# Patient Record
Sex: Female | Born: 1965 | Race: White | Hispanic: No | Marital: Married | Smoking: Never smoker
Health system: Southern US, Community
[De-identification: ages and names within clinical notes are randomized; demographics above are authoritative.]

## PROBLEM LIST (undated history)

## (undated) DIAGNOSIS — H6690 Otitis media, unspecified, unspecified ear: Secondary | ICD-10-CM

## (undated) DIAGNOSIS — J45909 Unspecified asthma, uncomplicated: Secondary | ICD-10-CM

## (undated) DIAGNOSIS — I10 Essential (primary) hypertension: Secondary | ICD-10-CM

## (undated) DIAGNOSIS — E785 Hyperlipidemia, unspecified: Secondary | ICD-10-CM

## (undated) DIAGNOSIS — M109 Gout, unspecified: Secondary | ICD-10-CM

## (undated) HISTORY — PX: OTHER SURGICAL HISTORY: SHX169

## (undated) HISTORY — DX: Gout, unspecified: M10.9

## (undated) HISTORY — DX: Essential (primary) hypertension: I10

## (undated) HISTORY — DX: Otitis media, unspecified, unspecified ear: H66.90

## (undated) HISTORY — DX: Hyperlipidemia, unspecified: E78.5

## (undated) HISTORY — DX: Unspecified asthma, uncomplicated: J45.909

---

## 2011-01-13 LAB — HM MAMMOGRAPHY: HM MAMMO: NEGATIVE

## 2012-02-20 LAB — HM PAP SMEAR: HM PAP: NORMAL

## 2012-10-06 ENCOUNTER — Other Ambulatory Visit: Payer: Self-pay

## 2013-07-02 ENCOUNTER — Other Ambulatory Visit: Payer: Self-pay | Admitting: *Deleted

## 2013-07-02 DIAGNOSIS — I10 Essential (primary) hypertension: Secondary | ICD-10-CM

## 2013-07-02 DIAGNOSIS — E785 Hyperlipidemia, unspecified: Secondary | ICD-10-CM

## 2013-07-02 DIAGNOSIS — M79609 Pain in unspecified limb: Secondary | ICD-10-CM

## 2013-07-02 DIAGNOSIS — R5381 Other malaise: Secondary | ICD-10-CM

## 2013-07-05 ENCOUNTER — Other Ambulatory Visit: Payer: Self-pay

## 2013-07-05 LAB — CBC WITH DIFFERENTIAL/PLATELET
Basophils Absolute: 0 10*3/uL (ref 0.0–0.1)
Basophils Relative: 0 % (ref 0–1)
Eosinophils Absolute: 0.2 10*3/uL (ref 0.0–0.7)
Eosinophils Relative: 2 % (ref 0–5)
HCT: 41.4 % (ref 36.0–46.0)
Hemoglobin: 14.1 g/dL (ref 12.0–15.0)
Lymphocytes Relative: 25 % (ref 12–46)
Lymphs Abs: 2.4 10*3/uL (ref 0.7–4.0)
MCH: 30.3 pg (ref 26.0–34.0)
MCHC: 34.1 g/dL (ref 30.0–36.0)
MCV: 89 fL (ref 78.0–100.0)
Monocytes Absolute: 0.4 10*3/uL (ref 0.1–1.0)
Monocytes Relative: 4 % (ref 3–12)
Neutro Abs: 6.7 10*3/uL (ref 1.7–7.7)
Neutrophils Relative %: 69 % (ref 43–77)
Platelets: 354 10*3/uL (ref 150–400)
RBC: 4.65 MIL/uL (ref 3.87–5.11)
RDW: 13.7 % (ref 11.5–15.5)
WBC: 9.7 10*3/uL (ref 4.0–10.5)

## 2013-07-05 LAB — URIC ACID: Uric Acid, Serum: 6.6 mg/dL (ref 2.4–7.0)

## 2013-07-05 LAB — LIPID PANEL
Cholesterol: 215 mg/dL — ABNORMAL HIGH (ref 0–200)
HDL: 53 mg/dL (ref >39)
LDL Cholesterol: 106 mg/dL — ABNORMAL HIGH (ref 0–99)
Total CHOL/HDL Ratio: 4.1 Ratio
Triglycerides: 280 mg/dL — ABNORMAL HIGH (ref <150)
VLDL: 56 mg/dL — ABNORMAL HIGH (ref 0–40)

## 2013-07-05 LAB — TSH: TSH: 1.423 u[IU]/mL (ref 0.350–4.500)

## 2013-07-07 ENCOUNTER — Other Ambulatory Visit: Payer: Self-pay | Admitting: Family Medicine

## 2013-07-16 ENCOUNTER — Encounter: Payer: Self-pay | Admitting: *Deleted

## 2013-07-16 DIAGNOSIS — I10 Essential (primary) hypertension: Secondary | ICD-10-CM

## 2013-07-16 DIAGNOSIS — E785 Hyperlipidemia, unspecified: Secondary | ICD-10-CM

## 2013-07-29 ENCOUNTER — Other Ambulatory Visit: Payer: Self-pay | Admitting: Family Medicine

## 2013-07-29 DIAGNOSIS — Z1231 Encounter for screening mammogram for malignant neoplasm of breast: Secondary | ICD-10-CM

## 2013-08-02 ENCOUNTER — Ambulatory Visit (HOSPITAL_BASED_OUTPATIENT_CLINIC_OR_DEPARTMENT_OTHER)
Admission: RE | Admit: 2013-08-02 | Discharge: 2013-08-02 | Disposition: A | Discharge status: Home / Self Care | Payer: BC Managed Care – PPO | Source: Ambulatory Visit | Attending: Family Medicine | Admitting: Family Medicine

## 2013-08-02 ENCOUNTER — Ambulatory Visit (INDEPENDENT_AMBULATORY_CARE_PROVIDER_SITE_OTHER): Payer: BC Managed Care – PPO | Admitting: Family Medicine

## 2013-08-02 ENCOUNTER — Encounter: Payer: Self-pay | Admitting: Family Medicine

## 2013-08-02 VITALS — BP 132/93 | HR 92 | Resp 16 | Ht 64.0 in | Wt 202.0 lb

## 2013-08-02 DIAGNOSIS — I10 Essential (primary) hypertension: Secondary | ICD-10-CM

## 2013-08-02 DIAGNOSIS — Z1231 Encounter for screening mammogram for malignant neoplasm of breast: Secondary | ICD-10-CM

## 2013-08-02 DIAGNOSIS — Z3049 Encounter for surveillance of other contraceptives: Secondary | ICD-10-CM

## 2013-08-02 MED ORDER — IRBESARTAN 300 MG PO TABS: 300.0000 mg | ORAL_TABLET | Freq: Every day | ORAL | Status: AC

## 2013-08-02 MED ORDER — NORETHIN-ETH ESTRADIOL-FE 0.8-25 MG-MCG PO CHEW: 1.0000 | CHEWABLE_TABLET | Freq: Every day | ORAL | Status: AC

## 2013-08-02 NOTE — Patient Instructions (Signed)
Hypertriglyceridemia  Diet for High blood levels of Triglycerides Most fats in food are triglycerides. Triglycerides in your blood are stored as fat in your body. High levels of triglycerides in your blood may put you at a greater risk for heart disease and stroke.  Normal triglyceride levels are less than 150 mg/dL. Borderline high levels are 150-199 mg/dl. High levels are 200 - 499 mg/dL, and very high triglyceride levels are greater than 500 mg/dL. The decision to treat high triglycerides is generally based on the level. For people with borderline or high triglyceride levels, treatment includes weight loss and exercise. Drugs are recommended for people with very high triglyceride levels. Many people who need treatment for high triglyceride levels have metabolic syndrome. This syndrome is a collection of disorders that often include: insulin resistance, high blood pressure, blood clotting problems, high cholesterol and triglycerides. TESTING PROCEDURE FOR TRIGLYCERIDES  You should not eat 4 hours before getting your triglycerides measured. The normal range of triglycerides is between 10 and 250 milligrams per deciliter (mg/dl). Some people may have extreme levels (1000 or above), but your triglyceride level may be too high if it is above 150 mg/dl, depending on what other risk factors you have for heart disease.  People with high blood triglycerides may also have high blood cholesterol levels. If you have high blood cholesterol as well as high blood triglycerides, your risk for heart disease is probably greater than if you only had high triglycerides. High blood cholesterol is one of the main risk factors for heart disease. CHANGING YOUR DIET  Your weight can affect your blood triglyceride level. If you are more than 20% above your ideal body weight, you may be able to lower your blood triglycerides by losing weight. Eating less and exercising regularly is the best way to combat this. Fat provides more  calories than any other food. The best way to lose weight is to eat less fat. Only 30% of your total calories should come from fat. Less than 7% of your diet should come from saturated fat. A diet low in fat and saturated fat is the same as a diet to decrease blood cholesterol. By eating a diet lower in fat, you may lose weight, lower your blood cholesterol, and lower your blood triglyceride level.  Eating a diet low in fat, especially saturated fat, may also help you lower your blood triglyceride level. Ask your dietitian to help you figure how much fat you can eat based on the number of calories your caregiver has prescribed for you.  Exercise, in addition to helping with weight loss may also help lower triglyceride levels.   Alcohol can increase blood triglycerides. You may need to stop drinking alcoholic beverages.  Too much carbohydrate in your diet may also increase your blood triglycerides. Some complex carbohydrates are necessary in your diet. These may include bread, rice, potatoes, other starchy vegetables and cereals.  Reduce "simple" carbohydrates. These may include pure sugars, candy, honey, and jelly without losing other nutrients. If you have the kind of high blood triglycerides that is affected by the amount of carbohydrates in your diet, you will need to eat less sugar and less high-sugar foods. Your caregiver can help you with this.  Adding 2-4 grams of fish oil (EPA+ DHA) may also help lower triglycerides. Speak with your caregiver before adding any supplements to your regimen. Following the Diet  Maintain your ideal weight. Your caregivers can help you with a diet. Generally, eating less food and getting more   exercise will help you lose weight. Joining a weight control group may also help. Ask your caregivers for a good weight control group in your area.  Eat low-fat foods instead of high-fat foods. This can help you lose weight too.  These foods are lower in fat. Eat MORE of these:    Dried beans, peas, and lentils.  Egg whites.  Low-fat cottage cheese.  Fish.  Lean cuts of meat, such as round, sirloin, rump, and flank (cut extra fat off meat you fix).  Whole grain breads, cereals and pasta.  Skim and nonfat dry milk.  Low-fat yogurt.  Poultry without the skin.  Cheese made with skim or part-skim milk, such as mozzarella, parmesan, farmers', ricotta, or pot cheese. These are higher fat foods. Eat LESS of these:   Whole milk and foods made from whole milk, such as American, blue, cheddar, monterey jack, and swiss cheese  High-fat meats, such as luncheon meats, sausages, knockwurst, bratwurst, hot dogs, ribs, corned beef, ground pork, and regular ground beef.  Fried foods. Limit saturated fats in your diet. Substituting unsaturated fat for saturated fat may decrease your blood triglyceride level. You will need to read package labels to know which products contain saturated fats.  These foods are high in saturated fat. Eat LESS of these:   Fried pork skins.  Whole milk.  Skin and fat from poultry.  Palm oil.  Butter.  Shortening.  Cream cheese.  Bacon.  Margarines and baked goods made from listed oils.  Vegetable shortenings.  Chitterlings.  Fat from meats.  Coconut oil.  Palm kernel oil.  Lard.  Cream.  Sour cream.  Fatback.  Coffee whiteners and non-dairy creamers made with these oils.  Cheese made from whole milk. Use unsaturated fats (both polyunsaturated and monounsaturated) moderately. Remember, even though unsaturated fats are better than saturated fats; you still want a diet low in total fat.  These foods are high in unsaturated fat:   Canola oil.  Sunflower oil.  Mayonnaise.  Almonds.  Peanuts.  Pine nuts.  Margarines made with these oils.  Safflower oil.  Olive oil.  Avocados.  Cashews.  Peanut butter.  Sunflower seeds.  Soybean oil.  Peanut  oil.  Olives.  Pecans.  Walnuts.  Pumpkin seeds. Avoid sugar and other high-sugar foods. This will decrease carbohydrates without decreasing other nutrients. Sugar in your food goes rapidly to your blood. When there is excess sugar in your blood, your liver may use it to make more triglycerides. Sugar also contains calories without other important nutrients.  Eat LESS of these:   Sugar, brown sugar, powdered sugar, jam, jelly, preserves, honey, syrup, molasses, pies, candy, cakes, cookies, frosting, pastries, colas, soft drinks, punches, fruit drinks, and regular gelatin.  Avoid alcohol. Alcohol, even more than sugar, may increase blood triglycerides. In addition, alcohol is high in calories and low in nutrients. Ask for sparkling water, or a diet soft drink instead of an alcoholic beverage. Suggestions for planning and preparing meals   Bake, broil, grill or roast meats instead of frying.  Remove fat from meats and skin from poultry before cooking.  Add spices, herbs, lemon juice or vinegar to vegetables instead of salt, rich sauces or gravies.  Use a non-stick skillet without fat or use no-stick sprays.  Cool and refrigerate stews and broth. Then remove the hardened fat floating on the surface before serving.  Refrigerate meat drippings and skim off fat to make low-fat gravies.  Serve more fish.  Use less butter,   margarine and other high-fat spreads on bread or vegetables.  Use skim or reconstituted non-fat dry milk for cooking.  Cook with low-fat cheeses.  Substitute low-fat yogurt or cottage cheese for all or part of the sour cream in recipes for sauces, dips or congealed salads.  Use half yogurt/half mayonnaise in salad recipes.  Substitute evaporated skim milk for cream. Evaporated skim milk or reconstituted non-fat dry milk can be whipped and substituted for whipped cream in certain recipes.  Choose fresh fruits for dessert instead of high-fat foods such as pies or  cakes. Fruits are naturally low in fat. When Dining Out   Order low-fat appetizers such as fruit or vegetable juice, pasta with vegetables or tomato sauce.  Select clear, rather than cream soups.  Ask that dressings and gravies be served on the side. Then use less of them.  Order foods that are baked, broiled, poached, steamed, stir-fried, or roasted.  Ask for margarine instead of butter, and use only a small amount.  Drink sparkling water, unsweetened tea or coffee, or diet soft drinks instead of alcohol or other sweet beverages. QUESTIONS AND ANSWERS ABOUT OTHER FATS IN THE BLOOD: SATURATED FAT, TRANS FAT, AND CHOLESTEROL What is trans fat? Trans fat is a type of fat that is formed when vegetable oil is hardened through a process called hydrogenation. This process helps makes foods more solid, gives them shape, and prolongs their shelf life. Trans fats are also called hydrogenated or partially hydrogenated oils.  What do saturated fat, trans fat, and cholesterol in foods have to do with heart disease? Saturated fat, trans fat, and cholesterol in the diet all raise the level of LDL "bad" cholesterol in the blood. The higher the LDL cholesterol, the greater the risk for coronary heart disease (CHD). Saturated fat and trans fat raise LDL similarly.  What foods contain saturated fat, trans fat, and cholesterol? High amounts of saturated fat are found in animal products, such as fatty cuts of meat, chicken skin, and full-fat dairy products like butter, whole milk, cream, and cheese, and in tropical vegetable oils such as palm, palm kernel, and coconut oil. Trans fat is found in some of the same foods as saturated fat, such as vegetable shortening, some margarines (especially hard or stick margarine), crackers, cookies, baked goods, fried foods, salad dressings, and other processed foods made with partially hydrogenated vegetable oils. Small amounts of trans fat also occur naturally in some animal  products, such as milk products, beef, and lamb. Foods high in cholesterol include liver, other organ meats, egg yolks, shrimp, and full-fat dairy products. How can I use the new food label to make heart-healthy food choices? Check the Nutrition Facts panel of the food label. Choose foods lower in saturated fat, trans fat, and cholesterol. For saturated fat and cholesterol, you can also use the Percent Daily Value (%DV): 5% DV or less is low, and 20% DV or more is high. (There is no %DV for trans fat.) Use the Nutrition Facts panel to choose foods low in saturated fat and cholesterol, and if the trans fat is not listed, read the ingredients and limit products that list shortening or hydrogenated or partially hydrogenated vegetable oil, which tend to be high in trans fat. POINTS TO REMEMBER:   Discuss your risk for heart disease with your caregivers, and take steps to reduce risk factors.  Change your diet. Choose foods that are low in saturated fat, trans fat, and cholesterol.  Add exercise to your daily routine if   it is not already being done. Participate in physical activity of moderate intensity, like brisk walking, for at least 30 minutes on most, and preferably all days of the week. No time? Break the 30 minutes into three, 10-minute segments during the day.  Stop smoking. If you do smoke, contact your caregiver to discuss ways in which they can help you quit.  Do not use street drugs.  Maintain a normal weight.  Maintain a healthy blood pressure.  Keep up with your blood work for checking the fats in your blood as directed by your caregiver. Document Released: 04/18/2004 Document Revised: 12/31/2011 Document Reviewed: 11/14/2008 [employer] Patient Information 2014 [employer].  

## 2013-08-02 NOTE — Progress Notes (Signed)
   Subjective:    Patient ID: Cecelia ByarsMelanie Odem, female    DOB: 22-Jun-1966, 48 y.o.   MRN: [id number]  HPI  Shawna OrleansMelanie is here today to go over her most recent lab results and to discuss the conditions listed below:   1)  Hypertension:  She takes Ibesartan 150 mg (1/2 of 300 mg) and needs a refill on it.    2)  Oral Contraceptive:  She needs a refill on her birth control.    Review of Systems  Genitourinary: Negative for menstrual problem.  Neurological: Negative for light-headedness.  All other systems reviewed and are negative.    Past Medical History  Diagnosis Date  . Otitis media   . Asthma   . Hypertension   . Gout   . Hyperlipidemia      Past Surgical History  Procedure Laterality Date  . 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 Stroke Father   . Hypertension Father   . Hyperlipidemia Father   . Thyroid disease Paternal Grandmother   . Heart attack Paternal Grandfather   . Stroke Paternal Grandfather   . Depression Paternal Grandfather   . Hypertension Paternal Grandfather   . Hyperlipidemia Paternal Grandfather   . Alzheimer's disease Maternal Grandmother     Allergies  Allergen Reactions  . Benzoyl Peroxide Cleanser [Benzoyl Peroxide] Hives     Immunization History  Administered Date(s) Administered  . Tdap 01/06/2009      Objective:   Physical Exam  Vitals reviewed. Constitutional: She is oriented to person, place, and time.  Eyes:  Conjunctivae are normal. No scleral icterus.  Neck: Neck supple. No thyromegaly present.  Cardiovascular: Normal rate, regular rhythm and normal heart sounds.   Pulmonary/Chest: Effort normal and breath sounds normal.  Musculoskeletal: She exhibits no edema and no tenderness.  Lymphadenopathy:    She has no cervical adenopathy.  Neurological: She is alert and oriented to person, place, and time.  Skin: Skin is warm and dry.  Psychiatric: She has a normal mood and affect. Her behavior is normal. Judgment and thought content normal.     Assessment & Plan:    Shawna OrleansMelanie was seen today for medication management.  Diagnoses and associated orders for this visit:  Essential hypertension, benign - irbesartan (AVAPRO) 300 MG tablet; Take 1 tablet (300 mg total) by mouth daily.  Surveillance of other previously prescribed contraceptive method - Norethindrone-Ethinyl Estradiol-Fe (GENERESS FE) 0.8-25 MG-MCG tablet; Chew 1 tablet by mouth daily.

## 2013-08-19 ENCOUNTER — Encounter: Payer: Self-pay | Admitting: Family Medicine

## 2013-08-19 ENCOUNTER — Ambulatory Visit (INDEPENDENT_AMBULATORY_CARE_PROVIDER_SITE_OTHER): Payer: BC Managed Care – PPO | Admitting: Family Medicine

## 2013-08-19 ENCOUNTER — Other Ambulatory Visit (HOSPITAL_COMMUNITY)
Admission: RE | Admit: 2013-08-19 | Discharge: 2013-08-19 | Disposition: A | Discharge status: Home / Self Care | Payer: BC Managed Care – PPO | Source: Ambulatory Visit | Attending: Family Medicine | Admitting: Family Medicine

## 2013-08-19 VITALS — BP 131/74 | HR 87 | Resp 16 | Ht 64.0 in | Wt 204.0 lb

## 2013-08-19 DIAGNOSIS — Z124 Encounter for screening for malignant neoplasm of cervix: Secondary | ICD-10-CM

## 2013-08-19 DIAGNOSIS — Z1151 Encounter for screening for human papillomavirus (HPV): Secondary | ICD-10-CM | POA: Insufficient documentation

## 2013-08-19 DIAGNOSIS — Z01419 Encounter for gynecological examination (general) (routine) without abnormal findings: Secondary | ICD-10-CM | POA: Insufficient documentation

## 2013-08-19 DIAGNOSIS — Z0000 Encounter for general adult medical examination without abnormal findings: Secondary | ICD-10-CM

## 2013-08-19 NOTE — Patient Instructions (Signed)

## 2013-08-19 NOTE — Progress Notes (Addendum)
Subjective:    Patient ID: Brianna Ramos, female    DOB: 1966/01/20, 48 y.o.   MRN: [id number]  HPI  Brianna Ramos is here today for her annual CPE with pap smear.  She has done well since her last office visit.  She does not have any medical concerns today.     Review of Systems  Constitutional: Negative for activity change, appetite change, fatigue and unexpected weight change.  HENT: Negative for congestion, dental problem, ear pain, hearing loss, trouble swallowing and voice change.   Eyes: Negative for pain, redness and visual disturbance.  Respiratory: Negative for cough and shortness of breath.   Cardiovascular: Negative for chest pain, palpitations and leg swelling.  Gastrointestinal: Negative for nausea, vomiting, abdominal pain, diarrhea, constipation and blood in stool.  Endocrine: Negative for cold intolerance, heat intolerance, polydipsia, polyphagia and polyuria.  Genitourinary: Negative for dysuria, urgency, frequency, hematuria, vaginal discharge and pelvic pain.  Musculoskeletal: Negative for arthralgias, back pain, joint swelling, myalgias and neck pain.  Skin: Negative for rash.  Neurological: Negative for dizziness, weakness and headaches.  Hematological: Negative for adenopathy. Does not bruise/bleed easily.  Psychiatric/Behavioral: Negative for sleep disturbance, dysphoric mood and decreased concentration. The patient is not nervous/anxious.       Past Medical History  Diagnosis Date  . Otitis media   . Asthma   . Hypertension   . Gout   . Hyperlipidemia      Past Surgical History  Procedure Laterality Date  . Right ear drum repair Right 20078     History   Social History Narrative   Marital Status: Married [name])    Children: None    Pets: Dog (Buddy)     Living Situation: Lives with husband    Occupation: School Librarian Horticulturist, commercial)    Education: Passenger transport manager   Tobacco Use/Exposure:  None    Alcohol Use:   Occasional (2 x per year)    Drug Use:  None   Diet:  Regular   Exercise:  Walking (Twice per day)    Hobbies: Quilting, Reading, Stained Glass               Family History  Problem Relation Age of Onset  . Stroke Father   . Hypertension Father   . Hyperlipidemia Father   . Thyroid disease Paternal Grandmother   . Heart attack Paternal Grandfather   . Stroke Paternal Grandfather   . Depression Paternal Grandfather   . Hypertension Paternal Grandfather   . Hyperlipidemia Paternal Grandfather   . Alzheimer's disease Maternal Grandmother      Current Outpatient Prescriptions on File Prior to Visit  Medication Sig Dispense Refill  . irbesartan (AVAPRO) 300 MG tablet Take 1 tablet (300 mg total) by mouth daily.  30 tablet  11  . Norethindrone-Ethinyl Estradiol-Fe (GENERESS FE) 0.8-25 MG-MCG tablet Chew 1 tablet by mouth daily.  1 Package  11   No current facility-administered medications on file prior to visit.     Allergies  Allergen Reactions  . Benzoyl Peroxide Cleanser [Benzoyl Peroxide] Hives     Immunization History  Administered Date(s) Administered  . Tdap 01/06/2009       Objective:   Physical Exam  Constitutional: She is oriented to person, place, and time. She appears well-developed and well-nourished.  HENT:  Head: Normocephalic and atraumatic.  Right Ear: External ear normal.  Left Ear: External ear normal.  Nose: Nose normal.  Mouth/Throat: Oropharynx is clear and moist.  Eyes:  Conjunctivae and EOM are normal. Pupils are equal, round, and reactive to light.  Neck: Normal range of motion. No thyromegaly present.  Cardiovascular: Normal rate, regular rhythm, normal heart sounds and intact distal pulses.  Exam reveals no gallop and no friction rub.   No murmur heard. Pulmonary/Chest: Effort normal and breath sounds normal. Right breast exhibits no inverted nipple, no mass, no nipple discharge, no skin change and no tenderness. Left breast exhibits no  inverted nipple, no mass, no nipple discharge, no skin change and no tenderness. Breasts are symmetrical.  Abdominal: Soft. Bowel sounds are normal. Hernia confirmed negative in the right inguinal area and confirmed negative in the left inguinal area.  Genitourinary: Vagina normal and uterus normal. Pelvic exam was performed with patient supine. There is no rash, tenderness or lesion on the right labia. There is no rash, tenderness or lesion on the left labia. No vaginal discharge found.  Musculoskeletal: Normal range of motion. She exhibits no edema and no tenderness.  Lymphadenopathy:    She has no cervical adenopathy.       Right: No inguinal adenopathy present.       Left: No inguinal adenopathy present.  Neurological: She is alert and oriented to person, place, and time. She has normal reflexes.  Skin: Skin is warm and dry.  Psychiatric: She has a normal mood and affect. Her behavior is normal. Judgment and thought content normal.      Assessment & Plan:    Brianna Ramos was seen today for annual exam.  Diagnoses and associated orders for this visit:  Routine general medical examination at a health care facility  Screening for malignant neoplasm of the cervix - Cytology - PAP

## 2014-01-01 ENCOUNTER — Other Ambulatory Visit: Payer: Self-pay | Admitting: Family Medicine

## 2014-09-29 IMAGING — MG MM DIGITAL SCREENING BILAT W/ CAD
4 series · 4 of 4 positions shown · non-contrast
Comparison: None.

CLINICAL DATA: Screening.

EXAM:
DIGITAL SCREENING BILATERAL MAMMOGRAM WITH CAD

[R CC]
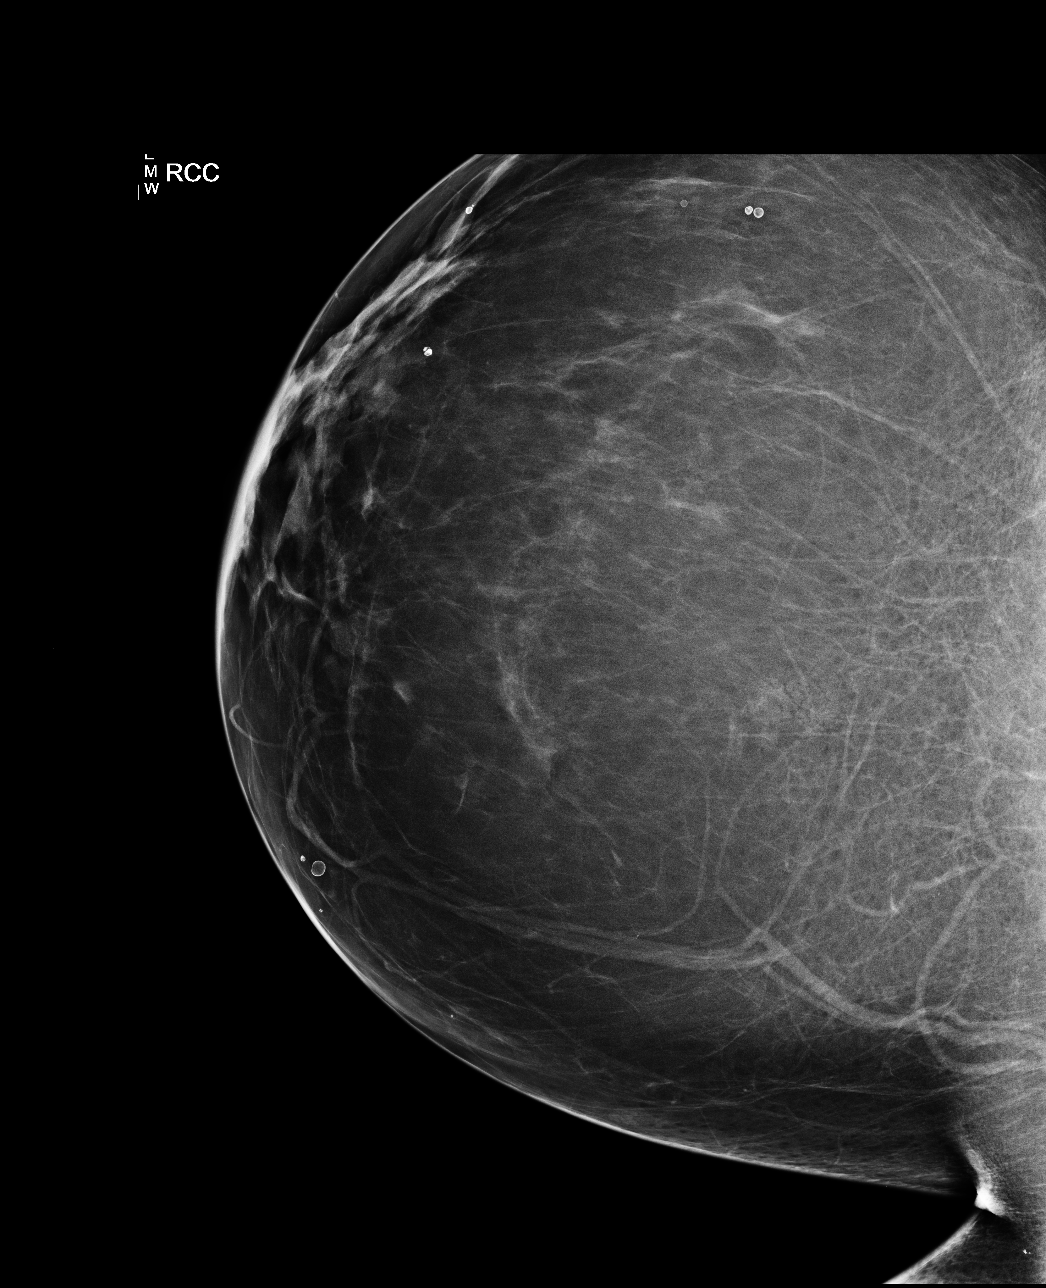

[L CC]
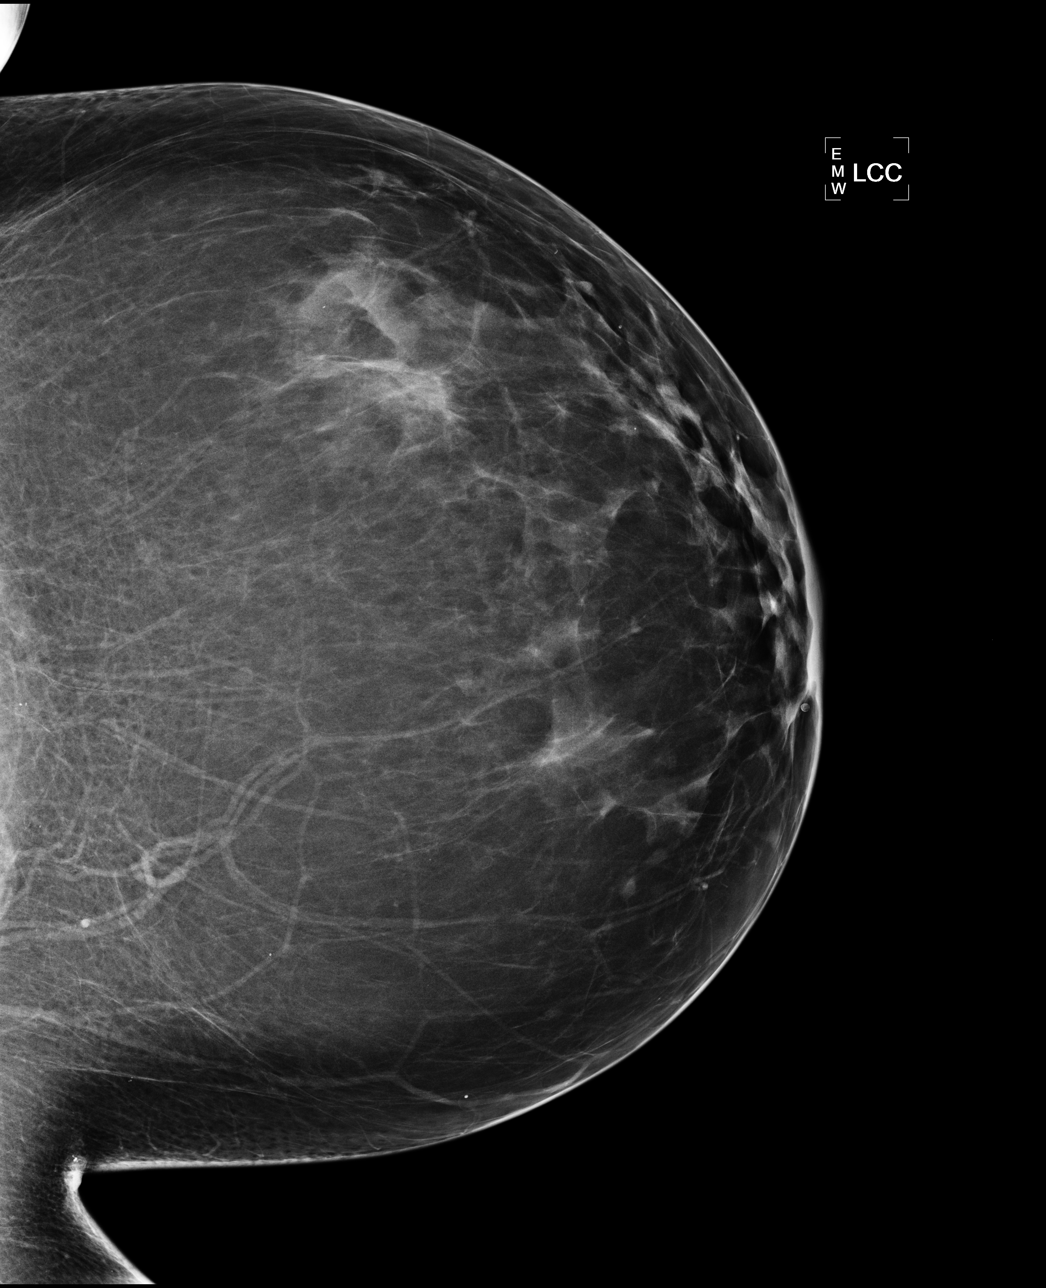

[L MLO]
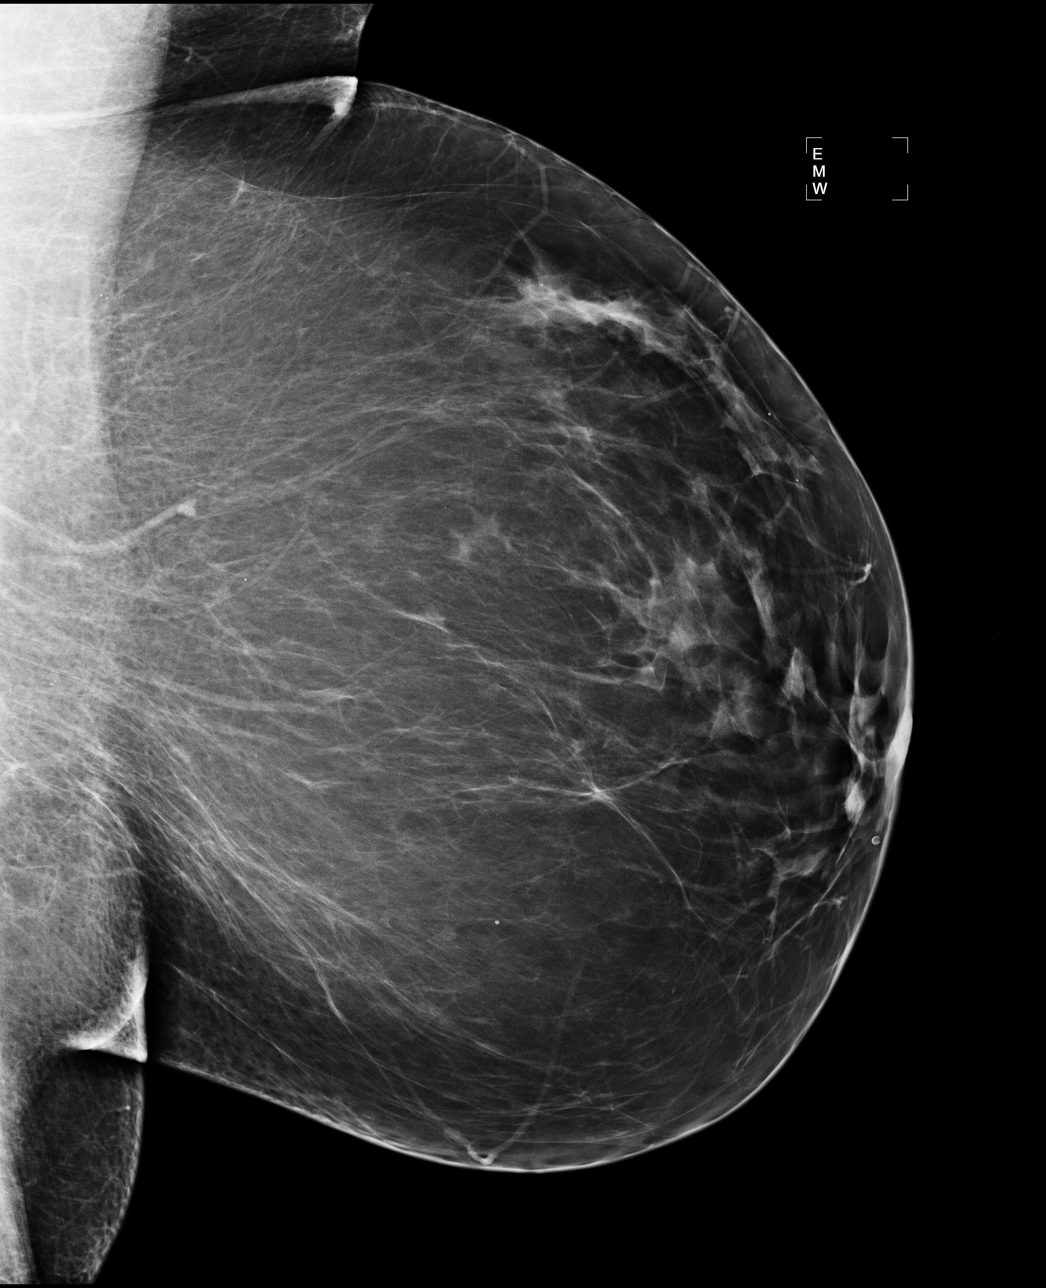

[R MLO]
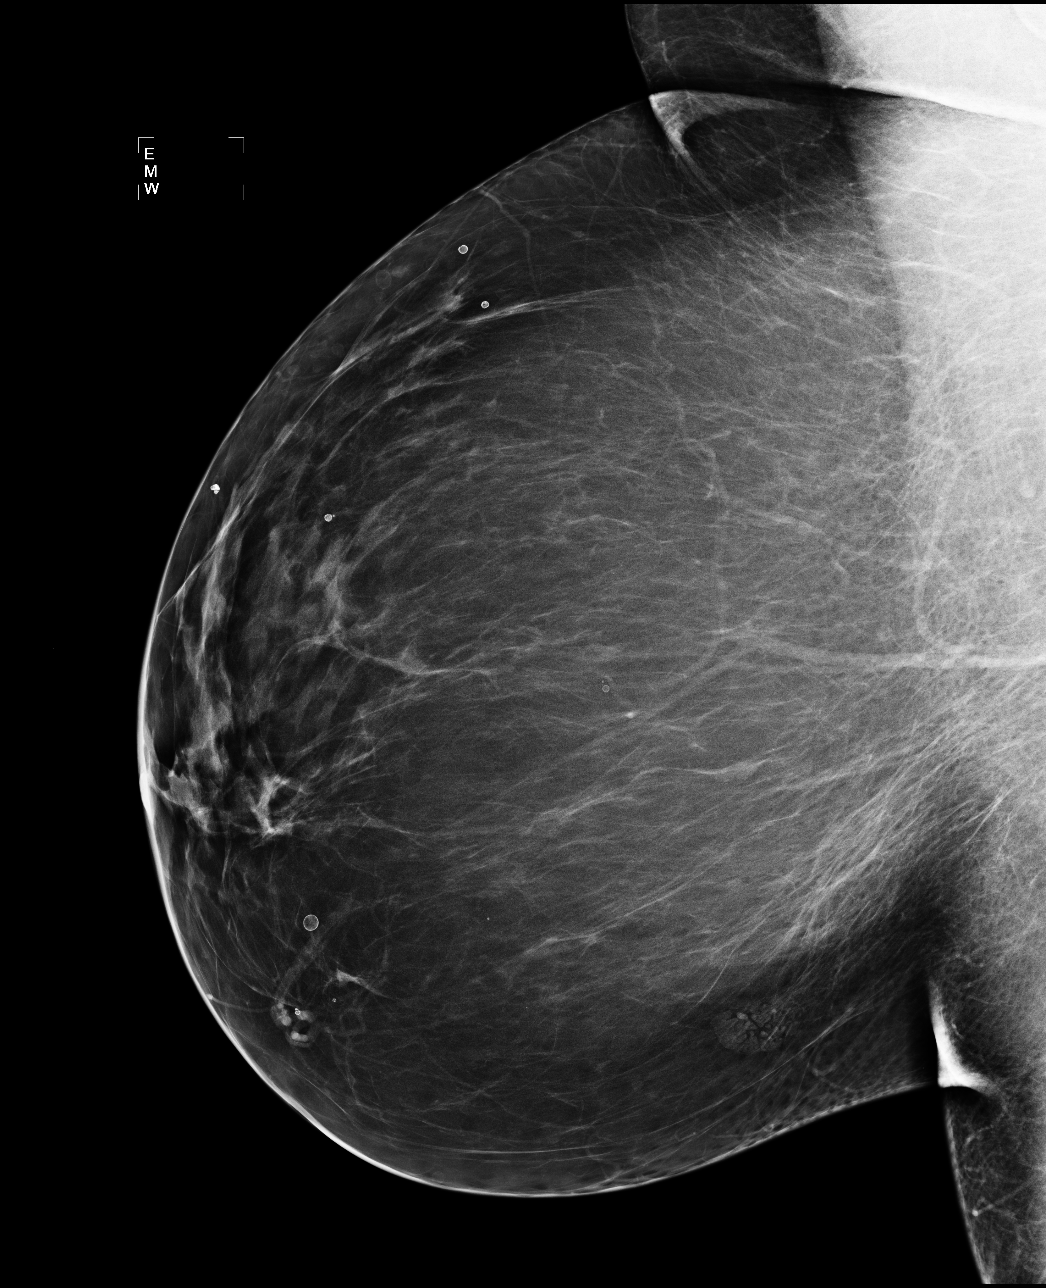

[4 of 4 positions shown; findings below may reference images not displayed]

ACR Breast Density Category b: There are scattered areas of
fibroglandular density.
FINDINGS: There are no findings suspicious for malignancy. Images were
processed with CAD.
IMPRESSION: No mammographic evidence of malignancy. A result letter of this
screening mammogram will be mailed directly to the patient.

RECOMMENDATION:
Screening mammogram in one year. (Code:WX-X-66M)

BI-RADS CATEGORY  1: Negative
